# Patient Record
Sex: Female | Born: 1955 | Race: White | Hispanic: No | Marital: Married | Smoking: Current every day smoker
Health system: Southern US, Community
[De-identification: ages and names within clinical notes are randomized; demographics above are authoritative.]

## PROBLEM LIST (undated history)

## (undated) DIAGNOSIS — G241 Genetic torsion dystonia: Secondary | ICD-10-CM

## (undated) HISTORY — PX: ABDOMINAL HYSTERECTOMY: SHX81

## (undated) HISTORY — PX: EYE SURGERY: SHX253

---

## 2013-04-26 ENCOUNTER — Emergency Department (HOSPITAL_COMMUNITY): Payer: 59

## 2013-04-26 ENCOUNTER — Emergency Department (HOSPITAL_COMMUNITY)
Admission: EM | Admit: 2013-04-26 | Discharge: 2013-04-26 | Disposition: A | Discharge status: Home / Self Care | Payer: 59 | Attending: Emergency Medicine | Admitting: Emergency Medicine

## 2013-04-26 ENCOUNTER — Encounter (HOSPITAL_COMMUNITY): Payer: Self-pay | Admitting: Emergency Medicine

## 2013-04-26 DIAGNOSIS — S8002XA Contusion of left knee, initial encounter: Secondary | ICD-10-CM

## 2013-04-26 DIAGNOSIS — F17200 Nicotine dependence, unspecified, uncomplicated: Secondary | ICD-10-CM | POA: Insufficient documentation

## 2013-04-26 DIAGNOSIS — S8000XA Contusion of unspecified knee, initial encounter: Secondary | ICD-10-CM | POA: Insufficient documentation

## 2013-04-26 DIAGNOSIS — S0993XA Unspecified injury of face, initial encounter: Secondary | ICD-10-CM | POA: Insufficient documentation

## 2013-04-26 DIAGNOSIS — S0990XA Unspecified injury of head, initial encounter: Secondary | ICD-10-CM | POA: Insufficient documentation

## 2013-04-26 DIAGNOSIS — S43499A Other sprain of unspecified shoulder joint, initial encounter: Secondary | ICD-10-CM | POA: Insufficient documentation

## 2013-04-26 DIAGNOSIS — Y9389 Activity, other specified: Secondary | ICD-10-CM | POA: Insufficient documentation

## 2013-04-26 DIAGNOSIS — Z8669 Personal history of other diseases of the nervous system and sense organs: Secondary | ICD-10-CM | POA: Insufficient documentation

## 2013-04-26 DIAGNOSIS — Y92410 Unspecified street and highway as the place of occurrence of the external cause: Secondary | ICD-10-CM | POA: Insufficient documentation

## 2013-04-26 DIAGNOSIS — S46812A Strain of other muscles, fascia and tendons at shoulder and upper arm level, left arm, initial encounter: Secondary | ICD-10-CM

## 2013-04-26 DIAGNOSIS — Z79899 Other long term (current) drug therapy: Secondary | ICD-10-CM | POA: Insufficient documentation

## 2013-04-26 HISTORY — DX: Genetic torsion dystonia: G24.1

## 2013-04-26 MED ORDER — CYCLOBENZAPRINE HCL 10 MG PO TABS: 10.0000 mg | ORAL_TABLET | Freq: Three times a day (TID) | ORAL | Status: AC | PRN

## 2013-04-26 MED ORDER — IBUPROFEN 800 MG PO TABS: 800.0000 mg | ORAL_TABLET | Freq: Three times a day (TID) | ORAL | Status: AC

## 2013-04-26 MED ORDER — CYCLOBENZAPRINE HCL 10 MG PO TABS
5.0000 mg | ORAL_TABLET | Freq: Once | ORAL | Status: AC
  Administered 2013-04-26: 5 mg via ORAL
  Filled 2013-04-26: qty 1

## 2013-04-26 MED ORDER — IBUPROFEN 800 MG PO TABS
800.0000 mg | ORAL_TABLET | Freq: Once | ORAL | Status: AC
Start: 2013-04-26 — End: 2013-04-26
  Administered 2013-04-26: 800 mg via ORAL
  Filled 2013-04-26: qty 1
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
## 2013-04-29 NOTE — ED Provider Notes (Signed)
Medical screening examination/treatment/procedure(s) were performed by non-physician practitioner and as supervising physician I was immediately available for consultation/collaboration.  EKG Interpretation   None        Vicki Chaffin, MD 04/29/13 0836 
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
